# Patient Record
Sex: Female | Born: 1975
Health system: Southern US, Community
[De-identification: ages and names within clinical notes are randomized; demographics above are authoritative.]

---

## 2008-08-19 ENCOUNTER — Ambulatory Visit (HOSPITAL_COMMUNITY): Admission: RE | Admit: 2008-08-19 | Discharge: 2008-08-19 | Payer: Self-pay | Admitting: Family Medicine

## 2008-08-29 ENCOUNTER — Inpatient Hospital Stay (HOSPITAL_COMMUNITY): Admission: AD | Admit: 2008-08-29 | Discharge: 2008-08-29 | Payer: Self-pay | Admitting: Obstetrics & Gynecology

## 2008-12-04 ENCOUNTER — Ambulatory Visit: Payer: Self-pay | Admitting: Physician Assistant

## 2008-12-04 ENCOUNTER — Inpatient Hospital Stay (HOSPITAL_COMMUNITY): Admission: AD | Admit: 2008-12-04 | Discharge: 2008-12-04 | Payer: Self-pay | Admitting: Obstetrics & Gynecology

## 2009-03-06 ENCOUNTER — Ambulatory Visit (HOSPITAL_COMMUNITY): Admission: RE | Admit: 2009-03-06 | Discharge: 2009-03-06 | Payer: Self-pay | Admitting: Obstetrics and Gynecology

## 2009-03-20 ENCOUNTER — Inpatient Hospital Stay (HOSPITAL_COMMUNITY): Admission: AD | Admit: 2009-03-20 | Discharge: 2009-03-20 | Payer: Self-pay | Admitting: Obstetrics and Gynecology

## 2009-07-24 ENCOUNTER — Inpatient Hospital Stay (HOSPITAL_COMMUNITY): Admission: AD | Admit: 2009-07-24 | Discharge: 2009-07-26 | Payer: Self-pay | Admitting: Obstetrics and Gynecology

## 2010-09-19 ENCOUNTER — Encounter: Payer: Self-pay | Admitting: Family Medicine

## 2010-11-20 ENCOUNTER — Emergency Department (HOSPITAL_COMMUNITY)
Admission: EM | Admit: 2010-11-20 | Discharge: 2010-11-20 | Disposition: A | Discharge status: Home / Self Care | Payer: Self-pay | Attending: Emergency Medicine | Admitting: Emergency Medicine

## 2010-11-20 DIAGNOSIS — X58XXXA Exposure to other specified factors, initial encounter: Secondary | ICD-10-CM | POA: Insufficient documentation

## 2010-11-20 DIAGNOSIS — H5710 Ocular pain, unspecified eye: Secondary | ICD-10-CM | POA: Insufficient documentation

## 2010-11-20 DIAGNOSIS — S058X9A Other injuries of unspecified eye and orbit, initial encounter: Secondary | ICD-10-CM | POA: Insufficient documentation

## 2010-12-01 LAB — CBC
HCT: 33.4 % — ABNORMAL LOW (ref 36.0–46.0)
Hemoglobin: 13.6 g/dL (ref 12.0–15.0)
MCV: 91.7 fL (ref 78.0–100.0)
RBC: 3.64 MIL/uL — ABNORMAL LOW (ref 3.87–5.11)
RBC: 4.41 MIL/uL (ref 3.87–5.11)
WBC: 7.2 10*3/uL (ref 4.0–10.5)

## 2010-12-08 LAB — GC/CHLAMYDIA PROBE AMP, GENITAL
Chlamydia, DNA Probe: NEGATIVE
GC Probe Amp, Genital: NEGATIVE

## 2010-12-08 LAB — WET PREP, GENITAL

## 2010-12-08 LAB — HCG, QUANTITATIVE, PREGNANCY: hCG, Beta Chain, Quant, S: 42728 m[IU]/mL — ABNORMAL HIGH (ref <5)

## 2010-12-09 LAB — POCT PREGNANCY, URINE: Preg Test, Ur: POSITIVE

## 2012-08-25 ENCOUNTER — Emergency Department (HOSPITAL_COMMUNITY): Payer: No Typology Code available for payment source

## 2012-08-25 ENCOUNTER — Emergency Department (HOSPITAL_COMMUNITY)
Admission: EM | Admit: 2012-08-25 | Discharge: 2012-08-25 | Disposition: A | Discharge status: Home / Self Care | Payer: No Typology Code available for payment source | Attending: Emergency Medicine | Admitting: Emergency Medicine

## 2012-08-25 ENCOUNTER — Encounter (HOSPITAL_COMMUNITY): Payer: Self-pay | Admitting: *Deleted

## 2012-08-25 DIAGNOSIS — S139XXA Sprain of joints and ligaments of unspecified parts of neck, initial encounter: Secondary | ICD-10-CM | POA: Insufficient documentation

## 2012-08-25 DIAGNOSIS — S0990XA Unspecified injury of head, initial encounter: Secondary | ICD-10-CM | POA: Insufficient documentation

## 2012-08-25 DIAGNOSIS — S6390XA Sprain of unspecified part of unspecified wrist and hand, initial encounter: Secondary | ICD-10-CM | POA: Insufficient documentation

## 2012-08-25 DIAGNOSIS — IMO0002 Reserved for concepts with insufficient information to code with codable children: Secondary | ICD-10-CM | POA: Insufficient documentation

## 2012-08-25 DIAGNOSIS — Y9389 Activity, other specified: Secondary | ICD-10-CM | POA: Insufficient documentation

## 2012-08-25 DIAGNOSIS — S6980XA Other specified injuries of unspecified wrist, hand and finger(s), initial encounter: Secondary | ICD-10-CM | POA: Insufficient documentation

## 2012-08-25 DIAGNOSIS — S6990XA Unspecified injury of unspecified wrist, hand and finger(s), initial encounter: Secondary | ICD-10-CM | POA: Insufficient documentation

## 2012-08-25 DIAGNOSIS — Y92410 Unspecified street and highway as the place of occurrence of the external cause: Secondary | ICD-10-CM | POA: Insufficient documentation

## 2012-08-25 DIAGNOSIS — S161XXA Strain of muscle, fascia and tendon at neck level, initial encounter: Secondary | ICD-10-CM

## 2012-08-25 DIAGNOSIS — S63609A Unspecified sprain of unspecified thumb, initial encounter: Secondary | ICD-10-CM

## 2012-08-25 DIAGNOSIS — S134XXA Sprain of ligaments of cervical spine, initial encounter: Secondary | ICD-10-CM

## 2012-08-25 MED ORDER — OXYCODONE-ACETAMINOPHEN 5-325 MG PO TABS
2.0000 | ORAL_TABLET | Freq: Once | ORAL | Status: AC
  Administered 2012-08-25: 2 via ORAL
  Filled 2012-08-25: qty 2

## 2012-08-25 MED ORDER — CYCLOBENZAPRINE HCL 10 MG PO TABS: 10.0000 mg | ORAL_TABLET | Freq: Two times a day (BID) | ORAL | Status: DC | PRN

## 2012-08-25 MED ORDER — HYDROCODONE-ACETAMINOPHEN 5-325 MG PO TABS: 2.0000 | ORAL_TABLET | Freq: Four times a day (QID) | ORAL | Status: DC | PRN
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
## 2012-08-25 NOTE — ED Provider Notes (Signed)
Medical screening examination/treatment/procedure(s) were performed by non-physician practitioner and as supervising physician I was immediately available for consultation/collaboration.   Gavin Pound. Yaminah Clayborn, MD 08/25/12 1556

## 2012-08-25 NOTE — ED Notes (Signed)
Pt alert and oriented x4. Respirations even and unlabored, bilateral symmetrical rise and fall of chest. Skin warm and dry. In no acute distress. Denies needs.   
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
## 2012-08-25 NOTE — ED Notes (Signed)
Pt escorted to discharge window. Pt verbalized understanding discharge instructions. In no acute distress.  

## 2018-10-15 ENCOUNTER — Other Ambulatory Visit: Payer: Self-pay

## 2018-10-15 ENCOUNTER — Encounter: Payer: Self-pay | Admitting: Family Medicine

## 2018-10-15 ENCOUNTER — Ambulatory Visit: Payer: PRIVATE HEALTH INSURANCE | Admitting: Family Medicine

## 2018-10-15 VITALS — BP 129/77 | HR 86 | Temp 98.8°F | Resp 16 | Ht 67.0 in | Wt 160.4 lb

## 2018-10-15 DIAGNOSIS — Z0001 Encounter for general adult medical examination with abnormal findings: Secondary | ICD-10-CM | POA: Diagnosis not present

## 2018-10-15 DIAGNOSIS — Z23 Encounter for immunization: Secondary | ICD-10-CM | POA: Diagnosis not present

## 2018-10-15 DIAGNOSIS — Z113 Encounter for screening for infections with a predominantly sexual mode of transmission: Secondary | ICD-10-CM

## 2018-10-15 DIAGNOSIS — H539 Unspecified visual disturbance: Secondary | ICD-10-CM

## 2018-10-15 DIAGNOSIS — Z13220 Encounter for screening for lipoid disorders: Secondary | ICD-10-CM | POA: Diagnosis not present

## 2018-10-15 DIAGNOSIS — E559 Vitamin D deficiency, unspecified: Secondary | ICD-10-CM

## 2018-10-15 DIAGNOSIS — Z1239 Encounter for other screening for malignant neoplasm of breast: Secondary | ICD-10-CM

## 2018-10-15 DIAGNOSIS — R6889 Other general symptoms and signs: Secondary | ICD-10-CM

## 2018-10-15 DIAGNOSIS — R238 Other skin changes: Secondary | ICD-10-CM | POA: Diagnosis not present

## 2018-10-15 DIAGNOSIS — Z0000 Encounter for general adult medical examination without abnormal findings: Secondary | ICD-10-CM

## 2018-10-15 DIAGNOSIS — Z136 Encounter for screening for cardiovascular disorders: Secondary | ICD-10-CM

## 2018-10-15 MED ORDER — VALACYCLOVIR HCL 1 G PO TABS: 1000.0000 mg | ORAL_TABLET | Freq: Three times a day (TID) | ORAL | 6 refills | Status: DC
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
## 2018-10-15 NOTE — Patient Instructions (Addendum)
Return in 2 weeks for pap smear   We recommend that you schedule a mammogram for breast cancer screening. Typically, you do not need a referral to do this. Please contact a local imaging center to schedule your mammogram.   [facility]) - [phone] or [phone]     If you have lab work done today you will be contacted with your lab results within the next 2 weeks.  If you have not heard from [place] then please contact us. The fastest way to get your results is to register for My Chart.   IF you received an x-ray today, you will receive an invoice from [facility]. Please contact [facility] at [phone] with questions or concerns regarding your invoice.   IF you received labwork today, you will receive an invoice from [facility]. Please contact [facility] at [phone] with questions or concerns regarding your invoice.   Our billing staff will not be able to assist you with questions regarding bills from these companies.  You will be contacted with the lab results as soon as they are available. The fastest way to get your results is to activate your My Chart account. Instructions are located on the last page of this paperwork. If you have not heard from [place] regarding the results in 2 weeks, please contact this office.

## 2018-10-16 LAB — CMP14+EGFR
A/G RATIO: 1.6 (ref 1.2–2.2)
ALT: 8 IU/L (ref 0–32)
AST: 22 IU/L (ref 0–40)
Albumin: 4.2 g/dL (ref 3.8–4.8)
Alkaline Phosphatase: 43 IU/L (ref 39–117)
BUN/Creatinine Ratio: 10 (ref 9–23)
BUN: 8 mg/dL (ref 6–24)
Bilirubin Total: 0.4 mg/dL (ref 0.0–1.2)
CALCIUM: 9.1 mg/dL (ref 8.7–10.2)
CO2: 23 mmol/L (ref 20–29)
Chloride: 105 mmol/L (ref 96–106)
Creatinine, Ser: 0.77 mg/dL (ref 0.57–1.00)
GFR, EST AFRICAN AMERICAN: 112 mL/min/{1.73_m2} (ref >59)
GFR, EST NON AFRICAN AMERICAN: 97 mL/min/{1.73_m2} (ref >59)
GLOBULIN, TOTAL: 2.7 g/dL (ref 1.5–4.5)
Glucose: 88 mg/dL (ref 65–99)
Potassium: 3.5 mmol/L (ref 3.5–5.2)
SODIUM: 140 mmol/L (ref 134–144)
TOTAL PROTEIN: 6.9 g/dL (ref 6.0–8.5)

## 2018-10-16 LAB — RPR: RPR Ser Ql: NONREACTIVE

## 2018-10-16 LAB — LIPID PANEL
Chol/HDL Ratio: 2.3 ratio (ref 0.0–4.4)
Cholesterol, Total: 104 mg/dL (ref 100–199)
HDL: 45 mg/dL (ref >39)
LDL Calculated: 51 mg/dL (ref 0–99)
Triglycerides: 38 mg/dL (ref 0–149)
VLDL CHOLESTEROL CAL: 8 mg/dL (ref 5–40)

## 2018-10-16 LAB — TSH: TSH: 0.735 u[IU]/mL (ref 0.450–4.500)

## 2018-10-16 LAB — HEPATITIS B SURFACE ANTIGEN: Hepatitis B Surface Ag: NEGATIVE

## 2018-10-16 LAB — VITAMIN D 25 HYDROXY (VIT D DEFICIENCY, FRACTURES): Vit D, 25-Hydroxy: 11.1 ng/mL — ABNORMAL LOW (ref 30.0–100.0)

## 2018-10-16 LAB — VARICELLA ZOSTER ANTIBODY, IGG: Varicella zoster IgG: 879 index (ref >165)

## 2018-10-16 LAB — HIV ANTIBODY (ROUTINE TESTING W REFLEX): HIV Screen 4th Generation wRfx: NONREACTIVE

## 2018-10-17 LAB — GC/CHLAMYDIA PROBE AMP
Chlamydia trachomatis, NAA: NEGATIVE
Neisseria gonorrhoeae by PCR: NEGATIVE

## 2018-10-21 ENCOUNTER — Other Ambulatory Visit: Payer: Self-pay | Admitting: Family Medicine

## 2018-10-21 DIAGNOSIS — E559 Vitamin D deficiency, unspecified: Secondary | ICD-10-CM

## 2018-10-21 MED ORDER — VITAMIN D (ERGOCALCIFEROL) 1.25 MG (50000 UNIT) PO CAPS: 50000.0000 [IU] | ORAL_CAPSULE | ORAL | 3 refills | Status: DC

## 2018-10-31 ENCOUNTER — Encounter: Payer: Self-pay | Admitting: Family Medicine

## 2018-10-31 ENCOUNTER — Ambulatory Visit
Admission: RE | Admit: 2018-10-31 | Discharge: 2018-10-31 | Disposition: A | Discharge status: Home / Self Care | Payer: No Typology Code available for payment source | Source: Ambulatory Visit | Attending: Family Medicine | Admitting: Family Medicine

## 2018-10-31 ENCOUNTER — Other Ambulatory Visit: Payer: Self-pay

## 2018-10-31 ENCOUNTER — Ambulatory Visit (INDEPENDENT_AMBULATORY_CARE_PROVIDER_SITE_OTHER): Payer: PRIVATE HEALTH INSURANCE | Admitting: Family Medicine

## 2018-10-31 VITALS — BP 120/74 | HR 83 | Temp 98.0°F | Resp 16 | Ht 67.0 in | Wt 159.0 lb

## 2018-10-31 DIAGNOSIS — Z124 Encounter for screening for malignant neoplasm of cervix: Secondary | ICD-10-CM | POA: Diagnosis not present

## 2018-10-31 DIAGNOSIS — N898 Other specified noninflammatory disorders of vagina: Secondary | ICD-10-CM

## 2018-10-31 DIAGNOSIS — Z1239 Encounter for other screening for malignant neoplasm of breast: Secondary | ICD-10-CM

## 2018-10-31 LAB — POCT WET + KOH PREP
Trich by wet prep: ABSENT
YEAST BY KOH: ABSENT
Yeast by wet prep: ABSENT

## 2018-10-31 MED ORDER — METRONIDAZOLE 500 MG PO TABS: 500.0000 mg | ORAL_TABLET | Freq: Two times a day (BID) | ORAL | 0 refills | Status: DC

## 2018-10-31 NOTE — Patient Instructions (Addendum)
If you have lab work done today you will be contacted with your lab results within the next 2 weeks.  If you have not heard from [place] then please contact us. The fastest way to get your results is to register for My Chart.   IF you received an x-ray today, you will receive an invoice from [facility]. Please contact [facility] at [phone] with questions or concerns regarding your invoice.   IF you received labwork today, you will receive an invoice from [facility]. Please contact [facility] at [phone] with questions or concerns regarding your invoice.   Our billing staff will not be able to assist you with questions regarding bills from these companies.  You will be contacted with the lab results as soon as they are available. The fastest way to get your results is to activate your My Chart account. Instructions are located on the last page of this paperwork. If you have not heard from [place] regarding the results in 2 weeks, please contact this office.      Pap Test Why am I having this test? A Pap test, also called a Pap smear, is a screening test to check for signs of:  Cancer of the vagina, cervix, and uterus. The cervix is the lower part of the uterus that opens into the vagina.  Infection.  Changes that may be a sign that cancer is developing (precancerous changes). Women need this test on a regular basis. In general, you should have a Pap test every 3 years until you reach menopause or age 74. Women aged 30-60 may choose to have their Pap test done at the same time as an HPV (human papillomavirus) test every 5 years (instead of every 3 years). Your health care provider may recommend having Pap tests more or less often depending on your medical conditions and past Pap test results. What kind of sample is taken?  Your health care provider will collect a sample of cells from the surface of your cervix. This will be done using a small cotton swab, plastic  spatula, or brush. This sample is often collected during a pelvic exam, when you are lying on your back on an exam table with feet in footrests (stirrups). In some cases, fluids (secretions) from the cervix or vagina may also be collected. How do I prepare for this test?  Be aware of where you are in your menstrual cycle. If you are menstruating on the day of the test, you may be asked to reschedule.  You may need to reschedule if you have a known vaginal infection on the day of the test.  Follow instructions from your health care provider about: ? Changing or stopping your regular medicines. Some medicines can cause abnormal test results, such as digitalis and tetracycline. ? Avoiding douching or taking a bath the day before or the day of the test. Tell a health care provider about:  Any allergies you have.  All medicines you are taking, including vitamins, herbs, eye drops, creams, and over-the-counter medicines.  Any blood disorders you have.  Any surgeries you have had.  Any medical conditions you have.  Whether you are pregnant or may be pregnant. How are the results reported? Your test results will be reported as either abnormal or normal. A false-positive result can occur. A false positive is incorrect because it means that a condition is present when it is not. A false-negative result can occur. A false negative is incorrect because it means  that a condition is not present when it is. What do the results mean? A normal test result means that you do not have signs of cancer of the vagina, cervix, or uterus. An abnormal result may mean that you have:  Cancer. A Pap test by itself is not enough to diagnose cancer. You will have more tests done in this case.  Precancerous changes in your vagina, cervix, or uterus.  Inflammation of the cervix.  An STD (sexually transmitted disease).  A fungal infection.  A parasite infection. Talk with your health care provider about  what your results mean. Questions to ask your health care provider Ask your health care provider, or the department that is doing the test:  When will my results be ready?  How will I get my results?  What are my treatment options?  What other tests do I need?  What are my next steps? Summary  In general, women should have a Pap test every 3 years until they reach menopause or age 54.  Your health care provider will collect a sample of cells from the surface of your cervix. This will be done using a small cotton swab, plastic spatula, or brush.  In some cases, fluids (secretions) from the cervix or vagina may also be collected. This information is not intended to replace advice given to you by your health care provider. Make sure you discuss any questions you have with your health care provider. Document Released: 11/05/2002 Document Revised: 04/24/2017 Document Reviewed: 04/24/2017 Elsevier Interactive Patient Education  2019 [employer].

## 2018-10-31 NOTE — Progress Notes (Signed)
Informed pt of labs, she verbalized understanding.  
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
## 2018-11-03 LAB — PAP IG AND HPV HIGH-RISK: HPV, high-risk: NEGATIVE

## 2018-11-05 ENCOUNTER — Ambulatory Visit: Payer: Self-pay

## 2019-03-20 ENCOUNTER — Telehealth: Payer: Self-pay | Admitting: Family Medicine

## 2019-03-20 NOTE — Telephone Encounter (Signed)
Left 3 VM and MyChart message to reschedule appt with Dr. Nolon Rod on 05/08/2019. Cancelled appt

## 2019-04-17 ENCOUNTER — Ambulatory Visit: Payer: BC Managed Care – PPO | Admitting: Family Medicine

## 2019-04-17 ENCOUNTER — Encounter: Payer: Self-pay | Admitting: Family Medicine

## 2019-04-17 ENCOUNTER — Ambulatory Visit (INDEPENDENT_AMBULATORY_CARE_PROVIDER_SITE_OTHER): Payer: BC Managed Care – PPO

## 2019-04-17 ENCOUNTER — Other Ambulatory Visit: Payer: Self-pay

## 2019-04-17 VITALS — BP 116/70 | HR 92 | Temp 98.7°F | Resp 17 | Ht 67.0 in | Wt 166.2 lb

## 2019-04-17 DIAGNOSIS — M79671 Pain in right foot: Secondary | ICD-10-CM

## 2019-04-17 DIAGNOSIS — E559 Vitamin D deficiency, unspecified: Secondary | ICD-10-CM

## 2019-04-17 NOTE — Patient Instructions (Addendum)
CLINICAL DATA:  Pain, primarily posteriorly  EXAM: RIGHT FOOT COMPLETE - 3+ VIEW  COMPARISON:  None.  FINDINGS: Frontal, oblique, and lateral views were obtained. There is no fracture or dislocation. Joint spaces appear normal. No erosive change.  IMPRESSION: No fracture or dislocation.  No evident arthropathy.   Electronically Signed   By: Bretta BangWilliam  Woodruff III M.D.   On: 04/17/2019 10:25    If you have lab work done today you will be contacted with your lab results within the next 2 weeks.  If you have not heard from us then please contact us. The fastest way to get your results is to register for My Chart.   IF you received an x-ray today, you will receive an invoice from [facility]. Please contact [facility] at [phone] with questions or concerns regarding your invoice.   IF you received labwork today, you will receive an invoice from [facility]. Please contact [facility] at [phone] with questions or concerns regarding your invoice.   Our billing staff will not be able to assist you with questions regarding bills from these companies.  You will be contacted with the lab results as soon as they are available. The fastest way to get your results is to activate your My Chart account. Instructions are located on the last page of this paperwork. If you have not heard from us regarding the results in 2 weeks, please contact this office.     Heel Spur  A heel spur is a bony growth that forms on the bottom of the heel bone (calcaneus). Heel spurs are common. They often cause inflammation in the band of tissue that connects the toes to the heel bone (plantar fascia). This may cause pain on the bottom of the foot, near the heel. Many people with plantar fasciitis also have heel spurs. However, spurs are not the cause of plantar fasciitis pain. What are the causes? The exact cause of heel spurs is not known. They may be caused by:  Pressure on the heel  bone.  Bands of tissue (tendons) pulling on the heel bone. What increases the risk? You are more likely to develop this condition if you:  Are older than 40.  Are overweight.  Have wear-and-tear arthritis (osteoarthritis).  Have plantar fascia inflammation.  Participate in sports or activities that include a lot of running or jumping.  Wear poorly fitted shoes. What are the signs or symptoms? Some people have no symptoms. If you do have symptoms, they may include:  Pain in the bottom of your heel.  Pain that is worse when you first get out of bed.  Pain that gets worse after walking or standing. How is this diagnosed? This condition may be diagnosed based on:  Your symptoms and medical history.  A physical exam.  A foot X-ray. How is this treated? Treatment for this condition depends on how much pain you have. Treatment options may include:  Doing stretching exercises.  Losing weight, if necessary.  Wearing specific shoes or inserts inside of shoes (orthotics) for comfort and support.  Wearing splints on your feet while you sleep. Splints keep your feet in a position (usually 90 degrees) that should prevent and relieve the pain you feel when you first get out of bed. They also make stretching easier in the morning.  Taking over-the-counter medicine to relieve pain, such as NSAIDs.  Using high-intensity sound waves to break up the heel spur (extracorporeal shock wave therapy).  Getting steroid injections in your heel  to reduce inflammation.  Having surgery, if your heel spur causes long-term (chronic) pain. Follow these instructions at home:  Activity  Avoid activities that cause pain until you recover, or for as long as directed by your health care provider.  Do stretching exercises as directed. Stretch before exercising or being physically active. Managing pain, stiffness, and swelling  If directed, put ice on your foot: ? Put ice in a plastic  bag. ? Place a towel between your skin and the bag. ? Leave the ice on for 20 minutes, 2-3 times a day.  Move your toes often to avoid stiffness and to lessen swelling.  When possible, raise (elevate) your foot above the level of your heart while you are sitting or lying down. General instructions  Take over-the-counter and prescription medicines only as told by your health care provider.  Wear supportive shoes that fit well. Wear splints, inserts, or orthotics as told by your health care provider.  If recommended, work with your health care provider to lose weight. This can relieve pressure on your foot.  Do not use any products that contain nicotine or tobacco, such as cigarettes and e-cigarettes. These can affect bone growth and healing. If you need help quitting, ask your health care provider.  Keep all follow-up visits as told by your health care provider. This is important. Contact a health care provider if:  Your pain does not go away with treatment.  Your pain gets worse. Summary  A heel spur is a bony growth that forms on the bottom of the heel bone (calcaneus).  Heel spurs often cause inflammation in the band of tissue that connects the toes to the heel bone (plantar fascia). This may cause pain on the bottom of the foot, near the heel.  Doing stretching exercises, losing weight, wearing specific shoes or shoe inserts, wearing splints while you sleep, and taking pain medicine may ease the pain and stiffness.  Other treatment options may include high-intensity sound waves to break up the heel spur, steroid injections, or surgery. This information is not intended to replace advice given to you by your health care provider. Make sure you discuss any questions you have with your health care provider. Document Released: 09/21/2005 Document Revised: 08/02/2017 Document Reviewed: 08/02/2017 Elsevier Patient Education  2020 [employer].
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
## 2019-04-18 LAB — VITAMIN D 25 HYDROXY (VIT D DEFICIENCY, FRACTURES): Vit D, 25-Hydroxy: 67.4 ng/mL (ref 30.0–100.0)

## 2019-04-29 ENCOUNTER — Ambulatory Visit: Payer: BC Managed Care – PPO | Admitting: Podiatry

## 2019-04-29 ENCOUNTER — Other Ambulatory Visit: Payer: Self-pay

## 2019-04-29 ENCOUNTER — Ambulatory Visit: Payer: BC Managed Care – PPO

## 2019-04-29 ENCOUNTER — Encounter: Payer: Self-pay | Admitting: Podiatry

## 2019-04-29 DIAGNOSIS — L600 Ingrowing nail: Secondary | ICD-10-CM | POA: Diagnosis not present

## 2019-04-29 DIAGNOSIS — M722 Plantar fascial fibromatosis: Secondary | ICD-10-CM

## 2019-05-01 NOTE — Progress Notes (Signed)
   Subjective: 43 y.o. female presenting today as a new patient with a chief complaint of right heel sharp, shooting pain that has been ongoing for the past 2-3 months. She also reports intermittent pain from possible ingrown nails of the lateral borders of the bilateral great toenails that have been present for the past several months. She has not had any treatment for the ingrowing nails but has been using Dr. Darrick Grinder' insoles for the heel pain. Walking and standing after being seated for a long periods of time increases the pain. Patient is here for further evaluation and treatment.    History reviewed. No pertinent past medical history.   Objective: Physical Exam General: The patient is alert and oriented x3 in no acute distress.  Dermatology: Lateral border of the bilateral great toes appears to be erythematous with evidence of an ingrowing nail. Pain on palpation noted to the border of the nail fold. Skin is warm, dry and supple bilateral lower extremities. Negative for open lesions or macerations bilateral.   Vascular: Dorsalis Pedis and Posterior Tibial pulses palpable bilateral.  Capillary fill time is immediate to all digits.  Neurological: Epicritic and protective threshold intact bilateral.   Musculoskeletal: Tenderness to palpation to the plantar aspect of the right heel along the plantar fascia. All other joints range of motion within normal limits bilateral. Strength 5/5 in all groups bilateral.   Assessment: 1. Plantar fasciitis right  Plan of Care:  1. Patient evaluated. X-Rays from PCP on 04/17/2019 reviewed.   2. Injection of 0.5cc Celestone soluspan injected into the right plantar fascia  3. Patient will consider ingrown procedures for next visit.  4. Rx for Diclofenac ordered for patient. 5. Plantar fascial band(s) dispensed 6. Instructed patient regarding therapies and modalities at home to alleviate symptoms.  7. Night splint dispensed.  8. Return to clinic in 4  weeks.     Edrick Kins, DPM [facility]  Dr. Edrick Kins, DPM    [address], [place] [address]                Office [phone]  Fax [phone]

## 2019-05-08 ENCOUNTER — Ambulatory Visit: Payer: PRIVATE HEALTH INSURANCE | Admitting: Family Medicine

## 2019-05-27 ENCOUNTER — Other Ambulatory Visit: Payer: Self-pay

## 2019-05-27 ENCOUNTER — Ambulatory Visit: Payer: BC Managed Care – PPO | Admitting: Podiatry

## 2019-05-27 DIAGNOSIS — L600 Ingrowing nail: Secondary | ICD-10-CM | POA: Diagnosis not present

## 2019-05-27 DIAGNOSIS — M722 Plantar fascial fibromatosis: Secondary | ICD-10-CM | POA: Diagnosis not present

## 2019-05-27 MED ORDER — DICLOFENAC SODIUM 75 MG PO TBEC: 75.0000 mg | DELAYED_RELEASE_TABLET | Freq: Two times a day (BID) | ORAL | 1 refills | Status: AC

## 2019-05-27 MED ORDER — GENTAMICIN SULFATE 0.1 % EX CREA: 1.0000 "application " | TOPICAL_CREAM | Freq: Two times a day (BID) | CUTANEOUS | 1 refills | Status: DC

## 2019-05-27 NOTE — Progress Notes (Signed)
   Subjective: 43 year old female presenting today for follow up evaluation of plantar fasciitis of the right foot. She states she is doing well and about 60% improved. She states the injection and the plantar fascial brace has provided significant relief. She states she does not use the night splint very often. There are no worsening factors noted at this time.  She also presents today for evaluation of intermittent pain to the medial border of the left great toe and the lateral border of the right great toe that began a couple weeks ago. Patient is concerned for possible ingrown nail. She has not done anything for treatment. Wearing shoes and applying pressure to the toe makes the pain worse. Patient presents today for further treatment and evaluation.  No past medical history on file.  Objective:  General: Well developed, nourished, in no acute distress, alert and oriented x3   Dermatology: Skin is warm, dry and supple bilateral. Lateral border of the right hallux and medial border of the left hallux appears to be erythematous with evidence of an ingrowing nail. Pain on palpation noted to the border of the nail fold. The remaining nails appear unremarkable at this time. There are no open sores, lesions.  Vascular: Dorsalis Pedis artery and Posterior Tibial artery pedal pulses palpable. No lower extremity edema noted.   Neruologic: Grossly intact via light touch bilateral.  Musculoskeletal: Pain with palpation noted to the right heel along the plantar fascia. Muscular strength within normal limits in all groups bilateral. Normal range of motion noted to all pedal and ankle joints.   Assesement: #1 Paronychia with ingrowing nail lateral border right hallux, medial border left hallux  #2 Pain in toe #3 Incurvated nail #4 Plantar fasciitis right - improved   Plan of Care:  1. Patient evaluated.  2. Discussed treatment alternatives and plan of care. Explained nail avulsion procedure and post  procedure course to patient. 3. Patient opted for permanent partial nail avulsion of the latera border of the right hallux and the medial border of the left hallux.  4. Prior to procedure, local anesthesia infiltration utilized using 3 ml of a 50:50 mixture of 2% plain lidocaine and 0.5% plain marcaine in a normal hallux block fashion and a betadine prep performed.  5. Partial permanent nail avulsion with chemical matrixectomy performed using [id number] applications of phenol followed by alcohol flush.  6. Light dressing applied. 7. Prescription for Gentamicin cream provided to patient to use daily with a bandage.  8. Injection of 0.5 mLs Celestone Soluspan injected into the right heel.  9. Prescription for Diclofenac provided to patient.  10. Return to clinic in 2 weeks.   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    Edrick Kins, DPM [facility]  Dr. Edrick Kins, [place]                                        [place], [place] [address]                Office [phone]  Fax [phone]

## 2019-05-27 NOTE — Patient Instructions (Signed)

## 2019-06-10 ENCOUNTER — Other Ambulatory Visit: Payer: Self-pay

## 2019-06-10 ENCOUNTER — Ambulatory Visit (INDEPENDENT_AMBULATORY_CARE_PROVIDER_SITE_OTHER): Payer: Self-pay | Admitting: Podiatry

## 2019-06-10 DIAGNOSIS — L600 Ingrowing nail: Secondary | ICD-10-CM

## 2019-06-10 MED ORDER — DOXYCYCLINE HYCLATE 100 MG PO TABS: 100.0000 mg | ORAL_TABLET | Freq: Two times a day (BID) | ORAL | 0 refills | Status: DC

## 2019-06-13 NOTE — Progress Notes (Signed)
   Subjective: 43 y.o. female presents today status post permanent nail avulsion procedure of the lateral border of the right hallux and medial border of the left hallux that was performed on 05/27/2019. She reports some continued pain. Touching the areas increases the pain. She has been using the Gentamicin cream and soaking the toes in Epsom salt. She denies drainage or bleeding. Patient is here for further evaluation and treatment.   No past medical history on file.  Objective: Skin is warm, dry and supple. Nail and respective nail fold appears to be healing appropriately. Open wound to the associated nail fold with a granular wound base and moderate amount of fibrotic tissue. Minimal drainage noted. Mild erythema around the periungual region likely due to phenol chemical matricectomy.  Assessment: #1 postop permanent partial nail avulsion lateral border right hallux; medial border left hallux #2 open wound periungual nail fold of respective digit.   Plan of care: #1 patient was evaluated  #2 debridement of open wound was performed to the periungual border of the respective toe using a currette. Antibiotic ointment and Band-Aid was applied. #3 Prescription for Doxycycline 100 mg #20 provided to patient.  #4 Continue using Gentamicin cream as directed.  #5 Continue soaking in Epsom salt daily.  #6 patient is to return to clinic in 2 weeks.  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    Edrick Kins, DPM [facility]  Dr. Edrick Kins, [place]                                        [place], [place] [address]                Office [phone]  Fax [phone]

## 2019-06-24 ENCOUNTER — Ambulatory Visit (INDEPENDENT_AMBULATORY_CARE_PROVIDER_SITE_OTHER): Payer: Self-pay | Admitting: Podiatry

## 2019-06-24 ENCOUNTER — Other Ambulatory Visit: Payer: Self-pay

## 2019-06-24 DIAGNOSIS — M722 Plantar fascial fibromatosis: Secondary | ICD-10-CM

## 2019-06-24 DIAGNOSIS — L600 Ingrowing nail: Secondary | ICD-10-CM

## 2019-06-26 NOTE — Progress Notes (Signed)
   Subjective: 43 y.o. female presents today status post permanent nail avulsion procedure of the lateral border of the right hallux that was performed on 06/10/2019. She states she is doing well but is still experiencing some tenderness. She has been soaking the toe, taking Doxycycline and using Gentamicin cream as directed. She denies drainage.  She also reports some returning pain secondary to plantar fasciitis of the right foot. She has been using the plantar fascial brace which helps alleviate the pain. Being on the foot for long periods of time increases the pain. Patient is here for further evaluation and treatment.   No past medical history on file.  Objective: Skin is warm, dry and supple. Nail and respective nail fold appears to be healing appropriately. Open wound to the associated nail fold with a granular wound base and moderate amount of fibrotic tissue. Minimal drainage noted. Mild erythema around the periungual region likely due to phenol chemical matricectomy. Tenderness to palpation to the plantar aspect of the right heel along the plantar fascia.  Assessment: #1 postop permanent partial nail avulsion lateral border right hallux #2 open wound periungual nail fold of respective digit.  #3 plantar fasciitis right   Plan of care: #1 patient was evaluated  #2 debridement of open wound was performed to the periungual border of the respective toe using a currette. Antibiotic ointment and Band-Aid was applied. #3 Recommended custom orthotics from [employer], in January when patient gets insurance through her new job.  #4 Continue using plantar fascial brace.  #5 patient is to return to clinic on a PRN basis.  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    Edrick Kins, DPM [facility]  Dr. Edrick Kins, [address]                                        [place], [place] [address]                Office [phone]  Fax [phone]

## 2019-06-29 ENCOUNTER — Encounter: Payer: Self-pay | Admitting: Podiatry

## 2019-07-01 MED ORDER — DOXYCYCLINE HYCLATE 100 MG PO TABS: 100.0000 mg | ORAL_TABLET | Freq: Two times a day (BID) | ORAL | 0 refills | Status: DC

## 2020-04-30 ENCOUNTER — Encounter: Payer: Self-pay | Admitting: Family Medicine

## 2020-04-30 ENCOUNTER — Other Ambulatory Visit: Payer: Self-pay

## 2020-04-30 ENCOUNTER — Ambulatory Visit (INDEPENDENT_AMBULATORY_CARE_PROVIDER_SITE_OTHER): Payer: Commercial Managed Care - PPO | Admitting: Family Medicine

## 2020-04-30 VITALS — BP 141/84 | HR 88 | Temp 97.8°F | Resp 16 | Ht 67.75 in | Wt 168.8 lb

## 2020-04-30 DIAGNOSIS — Z8639 Personal history of other endocrine, nutritional and metabolic disease: Secondary | ICD-10-CM

## 2020-04-30 DIAGNOSIS — Z0001 Encounter for general adult medical examination with abnormal findings: Secondary | ICD-10-CM

## 2020-04-30 DIAGNOSIS — Z833 Family history of diabetes mellitus: Secondary | ICD-10-CM | POA: Diagnosis not present

## 2020-04-30 DIAGNOSIS — Z0000 Encounter for general adult medical examination without abnormal findings: Secondary | ICD-10-CM

## 2020-04-30 DIAGNOSIS — Z23 Encounter for immunization: Secondary | ICD-10-CM

## 2020-04-30 DIAGNOSIS — N644 Mastodynia: Secondary | ICD-10-CM

## 2020-04-30 NOTE — Patient Instructions (Addendum)
We will try to let you know the results of your labs in the next week or so.  If you have further problems with your breast we will go ahead and order a another mammogram on you, but I do not believe it is needed to do a screening mammogram yet again at this age.  Return if problems arise  I like to encourage everyone to try and make wise decisions to be physically, emotionally, relationally, and spiritually healthy.  Your last tetanus vaccine was in early 2020 so you are not due for another 9 years unless you have a dirty wound.  Form was completed for your work.    If you have lab work done today you will be contacted with your lab results within the next 2 weeks.  If you have not heard from [place] then please contact us. The fastest way to get your results is to register for My Chart.   IF you received an x-ray today, you will receive an invoice from [facility]. Please contact [facility] at [phone] with questions or concerns regarding your invoice.   IF you received labwork today, you will receive an invoice from [place]. Please contact [facility] at [phone] with questions or concerns regarding your invoice.   Our billing staff will not be able to assist you with questions regarding bills from these companies.  You will be contacted with the lab results as soon as they are available. The fastest way to get your results is to activate your My Chart account. Instructions are located on the last page of this paperwork. If you have not heard from [place] regarding the results in 2 weeks, please contact this office.

## 2020-04-30 NOTE — Progress Notes (Signed)
Patient ID: NAKEISHA GREENHOUSE, female    DOB: 1976-07-31  Age: 44 y.o. MRN: [id number]  Chief Complaint  Patient presents with  . Annual Exam    pt here today for physical, needs forms for work filled out     Subjective:   Patient is here for her annual physical examination.  She does have some forms that need to be filled out for her workplace.  She has no major acute medical complaints today.  A week or 2 ago when she had a menstrual cycle she had pain in her right breast.  She had a normal mammogram a year ago.  She would like to go ahead and have a breast examination done today.  She does not need a pelvic exam.  She is not have a need of STD testing today.  Past medical history: Operations: None Gravida 5 para 2 AB 3 Medical illnesses: None.  She did have vitamin D deficiency in the past. Medications: Multivitamin Allergies: Latex causes a rash  Family history: Father is deceased.  He had chronic kidney disease from diabetes and hypertension.  Mother is still alive she has developed diabetes and has a history of asthma and hypertension.  3 siblings are living and well.  Social history: She is divorced though she has a fianc.  She is sexually involved and he is her only regular partner for a long time.  She has 2 children 12 and 11 who live with her.  She does not get any regular exercise and does not specifically watch her diet.  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  She was born in [place], raised for a time in [place] where her parents are from, and has not been back to [place] since returning here.  She speaks [place] and several [place] dialect's.  Review of systems: Constitutional: Unremarkable HEENT: Unremarkable Cardiovascular: Unremarkable Respiratory: Unremarkable Gastrointestinal: Unremarkable Genitourinary: Unremarkable Musculoskeletal: Unremarkable Neurologic: Unremarkable Psychiatric:  Unremarkable Dermatologic: Unremarkable Endocrine: Some PMS symptoms and the menstrual related breast pain as noted above      Current allergies, medications, problem list, past/family and social histories reviewed.  Objective:  BP (!) 141/84   Pulse 88   Temp 97.8 F (36.6 C) (Temporal)   Resp 16   Ht 5' 7.75" (1.721 m)   Wt 168 lb 12.8 oz (76.6 kg)   SpO2 97%   BMI 25.86 kg/m   Pleasant alert lady in no acute distress.  TMs normal.  Eyes PERRL.  She says she needs glasses but does not wear them.  Her throat is clear and teeth good.  Neck supple without nodes or thyromegaly.  No carotid bruits.  Chest clear to auscultation.  Heart rate without murmurs, gallops, or arrhythmias.  Breast exam reveals no axillary nodes.  There is no abnormalities on palpation of the breast.  Mild fibrous nodularity more on the left breast lower inner quadrant.  Abdomen soft without mass or tenderness good femoral pulses.  Pelvic exam not done.  Extremities unremarkable.  She has a history of some plantar fasciitis but the feet are nontender.  No deformities.  Good pedal pulses.  Assessment & Plan:   Assessment: 1. Annual physical exam   2. Needs flu shot   3. History of vitamin D deficiency   4. Family history of diabetes mellitus   5. Breast pain, right       Plan: Structures.  Orders Placed This Encounter  Procedures  . Flu Vaccine QUAD 6+ mos PF IM (Fluarix Quad PF)  . Hemoglobin A1c  . Comprehensive metabolic panel  . VITAMIN D 25 Hydroxy (Vit-D Deficiency, Fractures)    No orders of the defined types were placed in this encounter.        Patient Instructions   We will try to let you know the results of your labs in the next week or so.  If you have further problems with your breast we will go ahead and order a another mammogram on you, but I do not believe it is needed to do a screening mammogram yet again at this age.  Return if problems arise  I like to encourage  everyone to try and make wise decisions to be physically, emotionally, relationally, and spiritually healthy.  Your last tetanus vaccine was in early 2020 so you are not due for another 9 years unless you have a dirty wound.  Form was completed for your work.    If you have lab work done today you will be contacted with your lab results within the next 2 weeks.  If you have not heard from [place] then please contact us. The fastest way to get your results is to register for My Chart.   IF you received an x-ray today, you will receive an invoice from [facility]. Please contact [facility] at [phone] with questions or concerns regarding your invoice.   IF you received labwork today, you will receive an invoice from [place]. Please contact [facility] at [phone] with questions or concerns regarding your invoice.   Our billing staff will not be able to assist you with questions regarding bills from these companies.  You will be contacted with the lab results as soon as they are available. The fastest way to get your results is to activate your My Chart account. Instructions are located on the last page of this paperwork. If you have not heard from [place] regarding the results in 2 weeks, please contact this office.        Return in about 1 year (around 04/30/2021).   Janace Hoard, MD 04/30/2020

## 2020-05-01 LAB — COMPREHENSIVE METABOLIC PANEL
ALT: 13 IU/L (ref 0–32)
AST: 14 IU/L (ref 0–40)
Albumin/Globulin Ratio: 1.5 (ref 1.2–2.2)
Albumin: 4.1 g/dL (ref 3.8–4.8)
Alkaline Phosphatase: 49 IU/L (ref 48–121)
BUN/Creatinine Ratio: 8 — ABNORMAL LOW (ref 9–23)
BUN: 6 mg/dL (ref 6–24)
Bilirubin Total: 0.5 mg/dL (ref 0.0–1.2)
CO2: 26 mmol/L (ref 20–29)
Calcium: 9.3 mg/dL (ref 8.7–10.2)
Chloride: 104 mmol/L (ref 96–106)
Creatinine, Ser: 0.79 mg/dL (ref 0.57–1.00)
GFR calc Af Amer: 107 mL/min/{1.73_m2} (ref >59)
GFR calc non Af Amer: 93 mL/min/{1.73_m2} (ref >59)
Globulin, Total: 2.7 g/dL (ref 1.5–4.5)
Glucose: 93 mg/dL (ref 65–99)
Potassium: 3.9 mmol/L (ref 3.5–5.2)
Sodium: 141 mmol/L (ref 134–144)
Total Protein: 6.8 g/dL (ref 6.0–8.5)

## 2020-05-01 LAB — HEMOGLOBIN A1C
Est. average glucose Bld gHb Est-mCnc: 114 mg/dL
Hgb A1c MFr Bld: 5.6 % (ref 4.8–5.6)

## 2020-05-01 LAB — VITAMIN D 25 HYDROXY (VIT D DEFICIENCY, FRACTURES): Vit D, 25-Hydroxy: 37.3 ng/mL (ref 30.0–100.0)

## 2021-02-26 IMAGING — MG DIGITAL SCREENING BILATERAL MAMMOGRAM WITH CAD
4 series · 4 of 4 positions shown · non-contrast
Comparison: None.

CLINICAL DATA: Screening.

EXAM:
DIGITAL SCREENING BILATERAL MAMMOGRAM WITH CAD

[R MLO]
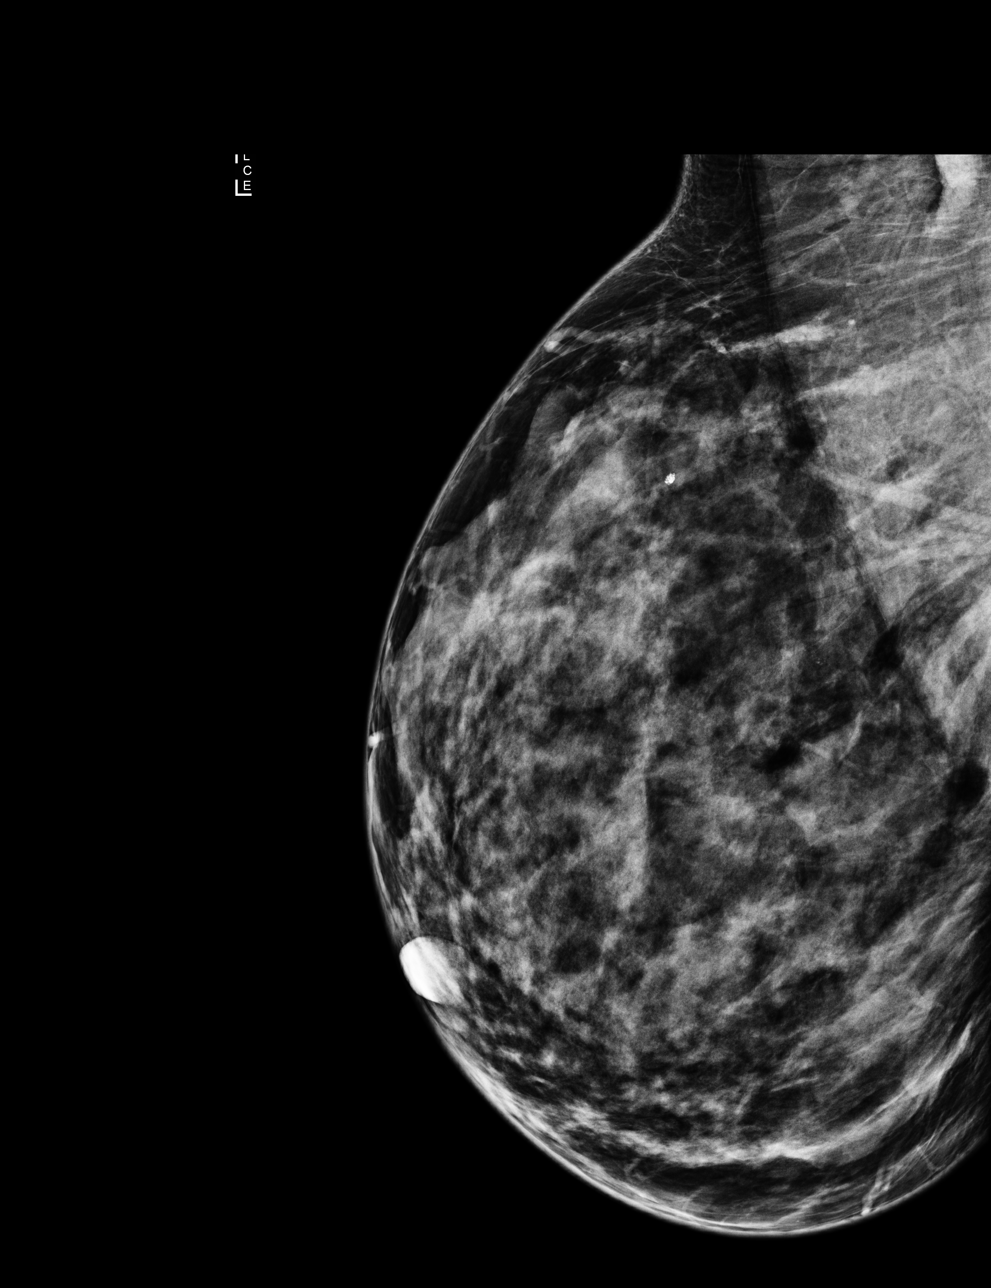

[R CC]
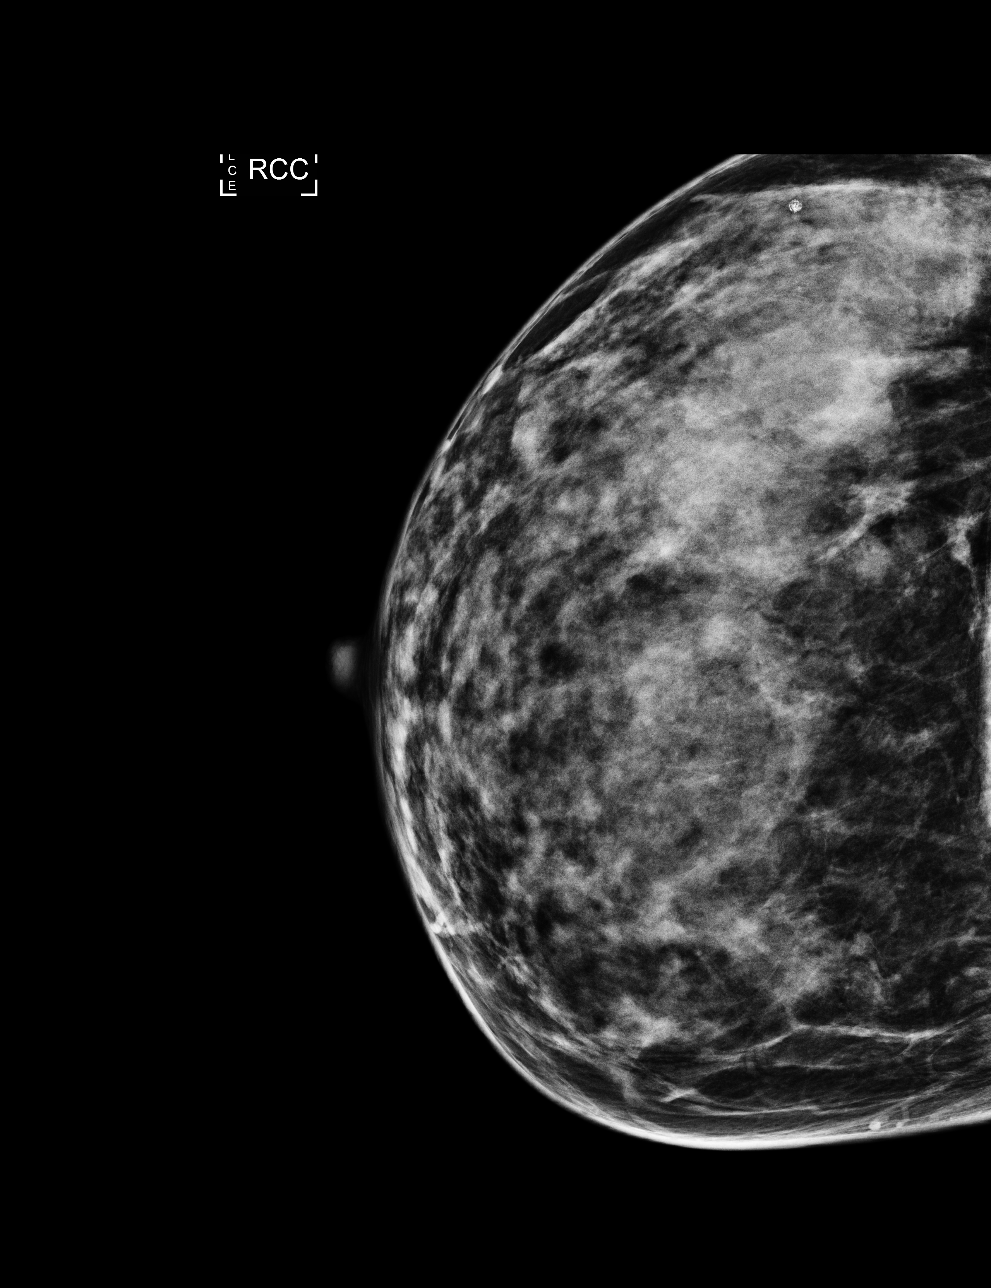

[L CC]
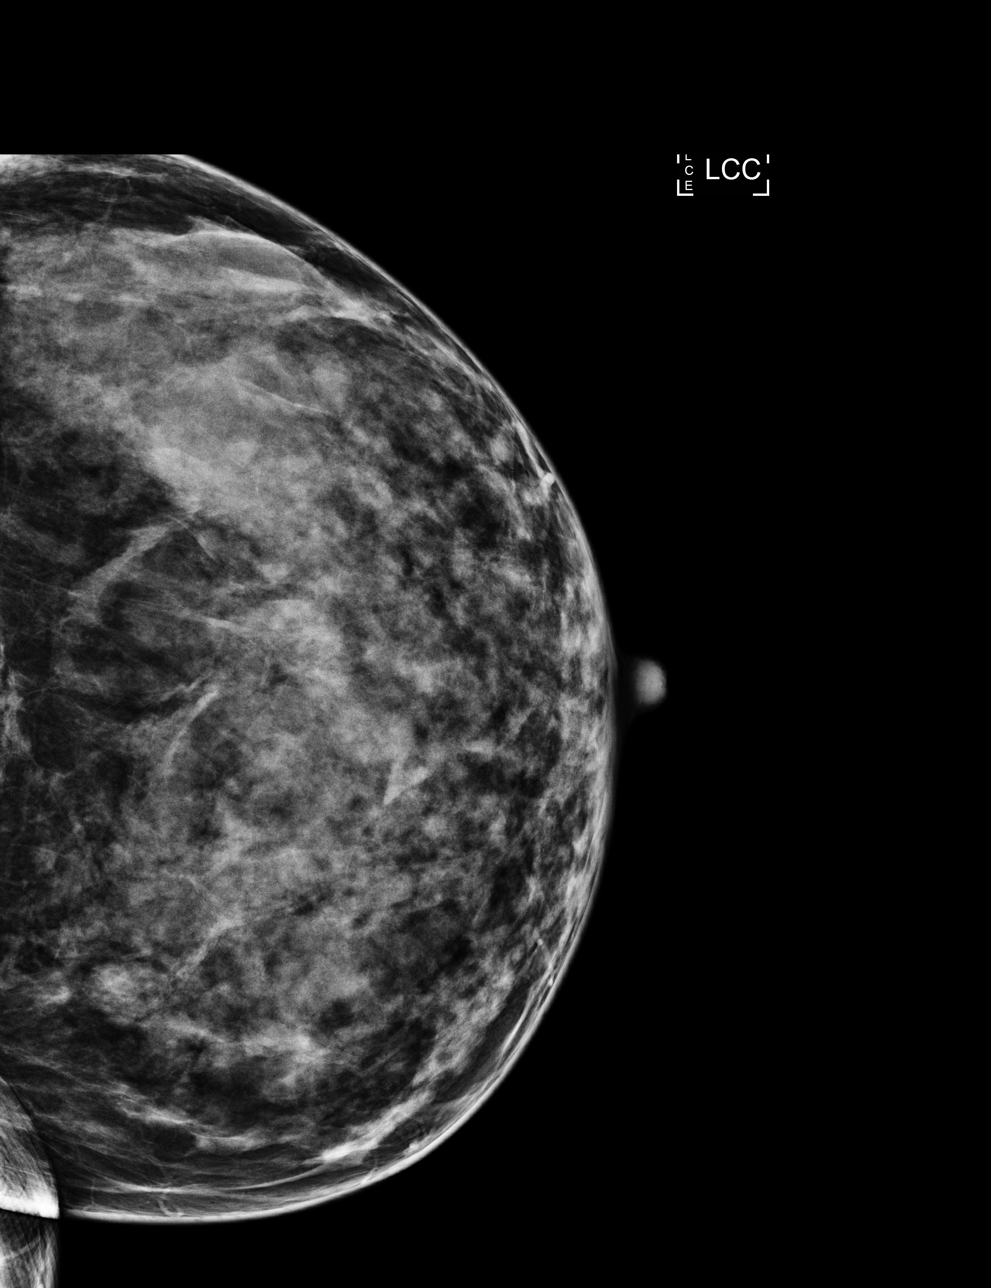

[L MLO]
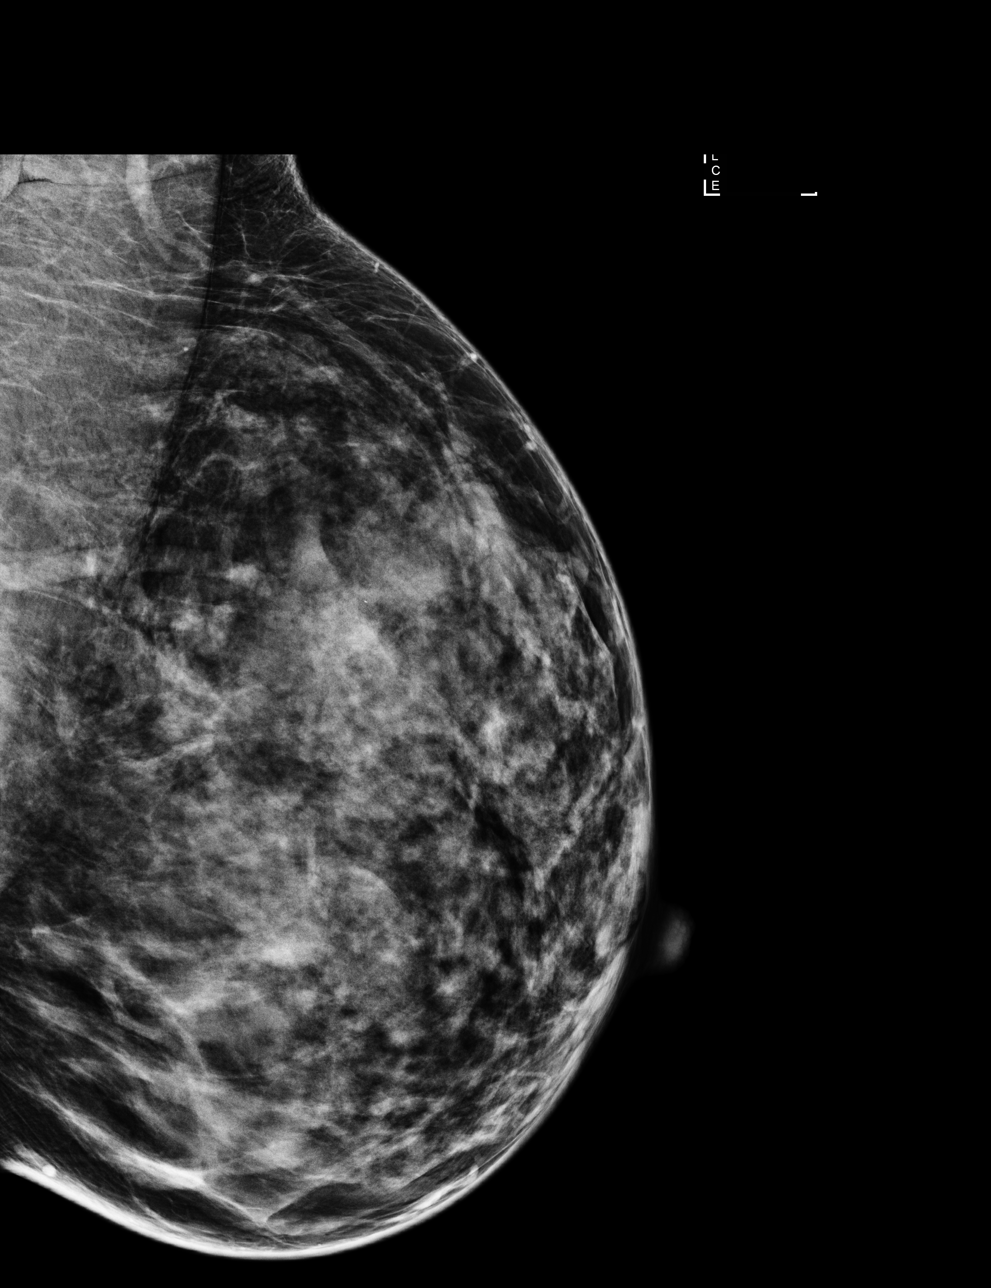

[4 of 4 positions shown; findings below may reference images not displayed]

ACR Breast Density Category d: The breast tissue is extremely dense,
which lowers the sensitivity of mammography.
FINDINGS: There are no findings suspicious for malignancy. Images were
processed with CAD.
IMPRESSION: No mammographic evidence of malignancy. A result letter of this
screening mammogram will be mailed directly to the patient.

RECOMMENDATION:
Screening mammogram in one year. (Code:8A-L-C49)

BI-RADS CATEGORY  1: Negative.

## 2021-08-13 IMAGING — DX RIGHT FOOT COMPLETE - 3+ VIEW
3 series · 3 of 3 positions shown · non-contrast
Comparison: None.

CLINICAL DATA: Pain, primarily posteriorly

EXAM:
RIGHT FOOT COMPLETE - 3+ VIEW

[foot ap]
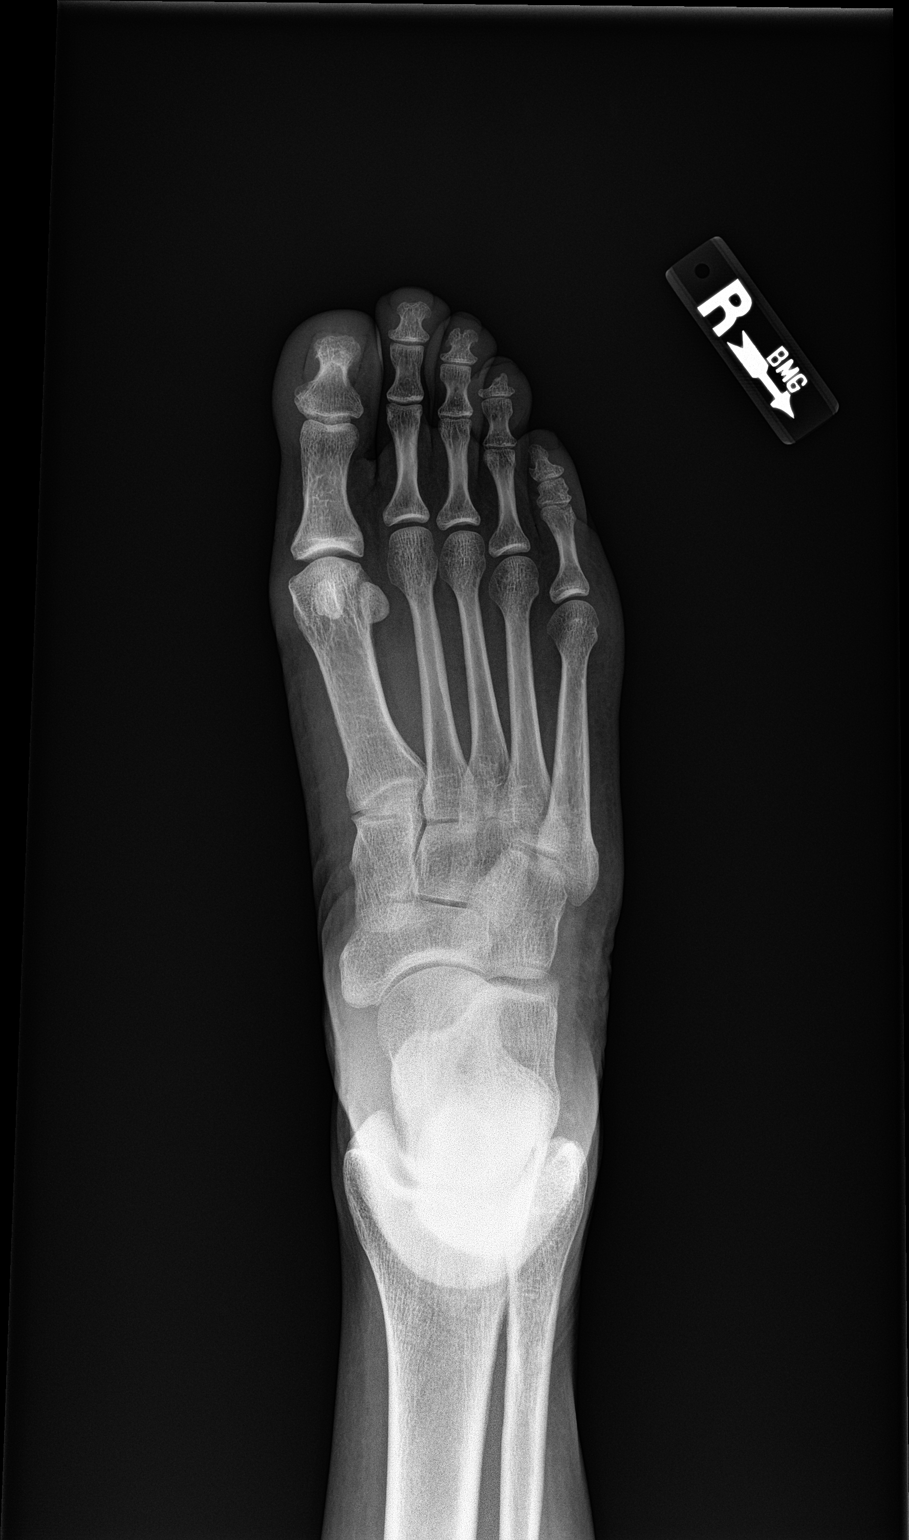

[foot obl]
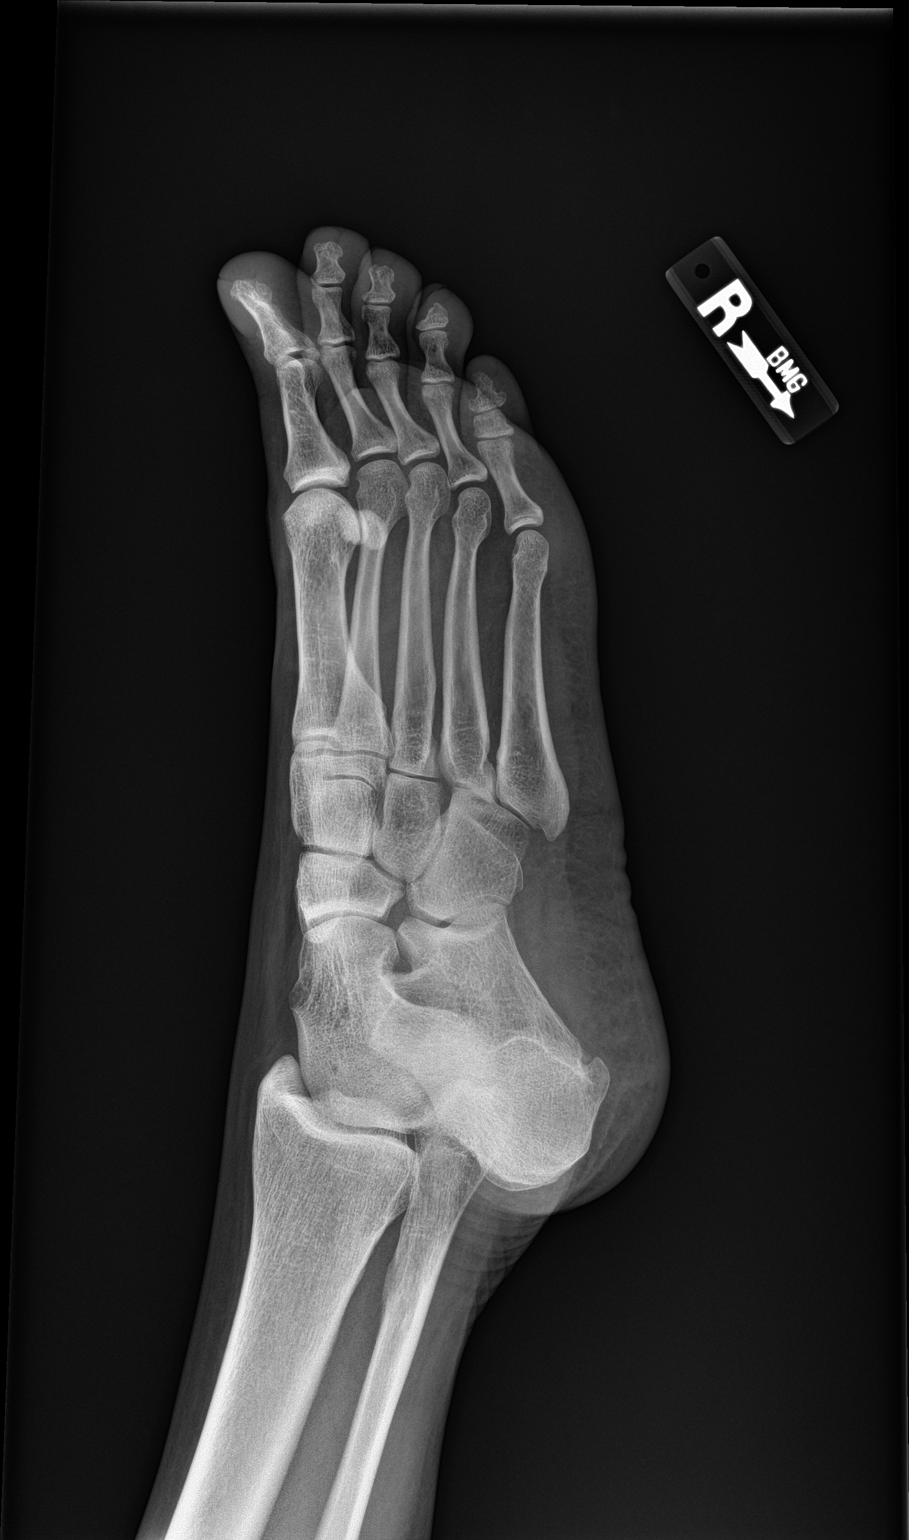

[foot lat]
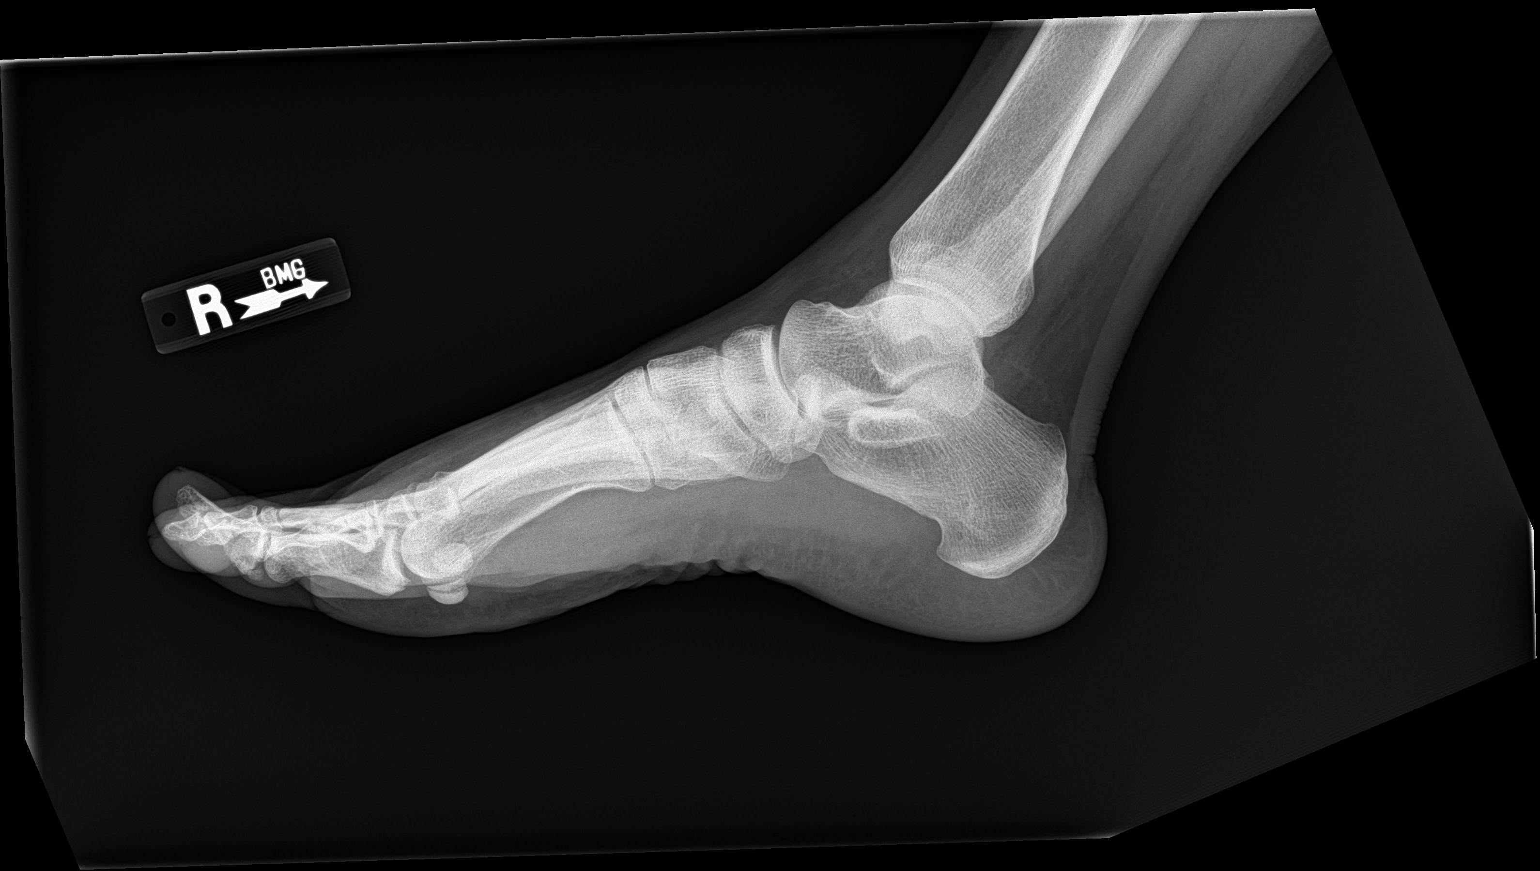

[3 of 3 positions shown; findings below may reference images not displayed]

FINDINGS: Frontal, oblique, and lateral views were obtained. There is no
fracture or dislocation. Joint spaces appear normal. No erosive
change.
IMPRESSION: No fracture or dislocation.  No evident arthropathy.

## 2022-05-25 ENCOUNTER — Other Ambulatory Visit: Payer: Self-pay | Admitting: Physician Assistant

## 2022-05-25 DIAGNOSIS — Z1231 Encounter for screening mammogram for malignant neoplasm of breast: Secondary | ICD-10-CM

## 2022-05-30 ENCOUNTER — Ambulatory Visit
Admission: RE | Admit: 2022-05-30 | Discharge: 2022-05-30 | Disposition: A | Discharge status: Home / Self Care | Payer: Commercial Managed Care - PPO | Source: Ambulatory Visit | Attending: Physician Assistant | Admitting: Physician Assistant

## 2022-05-30 DIAGNOSIS — Z1231 Encounter for screening mammogram for malignant neoplasm of breast: Secondary | ICD-10-CM
